# Patient Record
Sex: Male | Born: 1975 | Race: Black or African American | Hispanic: No | Marital: Married | Smoking: Never smoker
Health system: Southern US, Community
[De-identification: ages and names within clinical notes are randomized; demographics above are authoritative.]

---

## 2006-09-20 ENCOUNTER — Emergency Department (HOSPITAL_COMMUNITY): Admission: EM | Admit: 2006-09-20 | Discharge: 2006-09-20 | Payer: Self-pay | Admitting: Emergency Medicine

## 2008-09-21 ENCOUNTER — Ambulatory Visit (HOSPITAL_COMMUNITY): Admission: RE | Admit: 2008-09-21 | Discharge: 2008-09-21 | Payer: Self-pay | Admitting: Family Medicine

## 2009-07-08 ENCOUNTER — Emergency Department (HOSPITAL_COMMUNITY): Admission: EM | Admit: 2009-07-08 | Discharge: 2009-07-08 | Payer: Self-pay | Admitting: Emergency Medicine

## 2010-02-25 ENCOUNTER — Emergency Department (HOSPITAL_COMMUNITY): Admission: EM | Admit: 2010-02-25 | Discharge: 2010-02-25 | Payer: Self-pay | Admitting: Emergency Medicine

## 2010-04-22 ENCOUNTER — Encounter: Admission: RE | Admit: 2010-04-22 | Discharge: 2010-04-22 | Payer: Self-pay | Admitting: Orthopedic Surgery

## 2011-10-17 IMAGING — US US EXTREM LOW VENOUS*R*
1 series · 14 of 24 positions shown · non-contrast
Comparison: None.

CLINICAL DATA: 6 weeks proximal right calf pain.  Right Achilles
tendon repair 6 weeks ago.

RIGHT LOWER EXTREMITY VENOUS DUPLEX ULTRASOUND
TECHNIQUE: Gray-scale sonography with graded compression, as well
as color Doppler and duplex ultrasound were performed to evaluate
the deep venous system of the lower extremity from the level of the
common femoral vein through the popliteal and proximal calf veins.
Spectral Doppler was utilized to evaluate flow at rest and with
distal augmentation maneuvers.

[Series 1: us extrem low venous*right* · 14 of 34 slices shown]
[im 1/34]
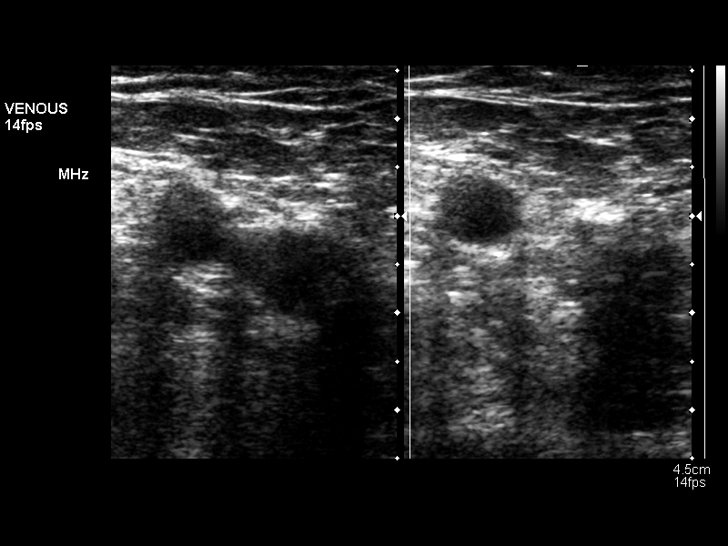
[im 3/34]
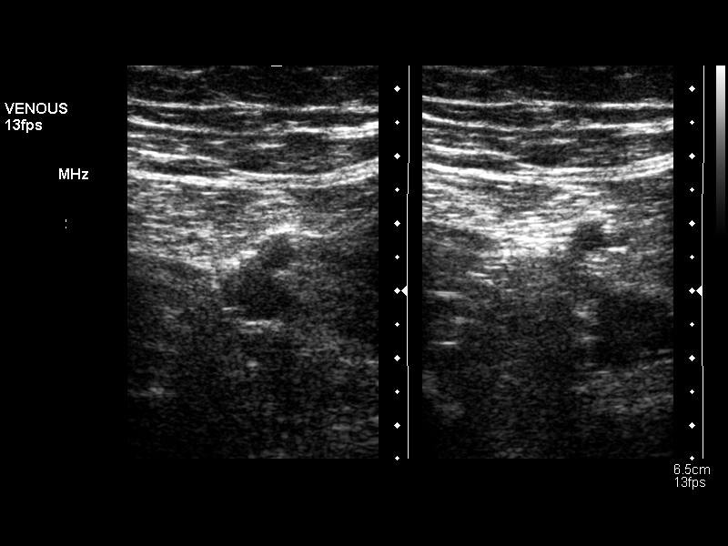
[im 6/34]
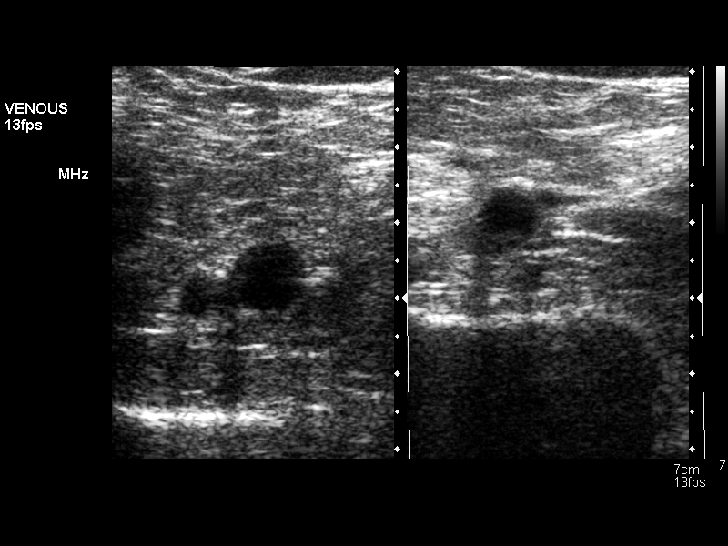
[im 9/34]
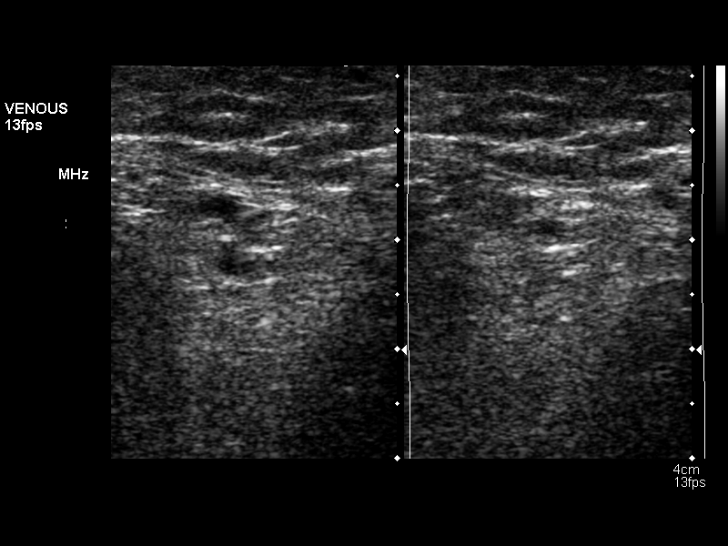
[im 11/34]
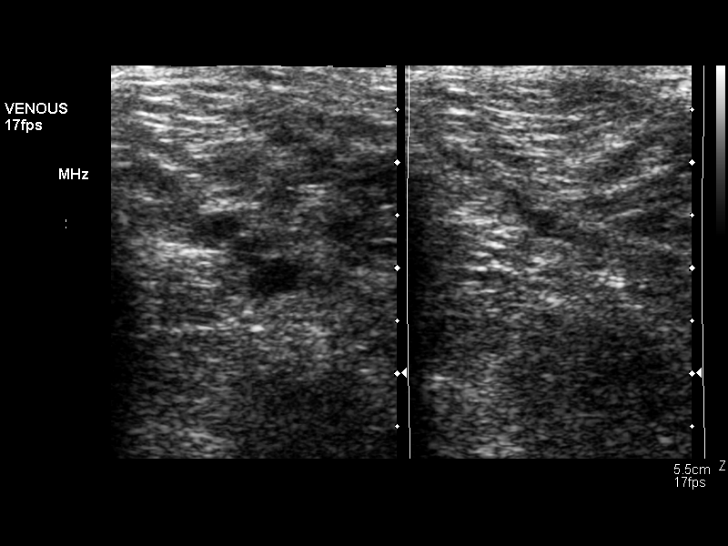
[im 13/34]
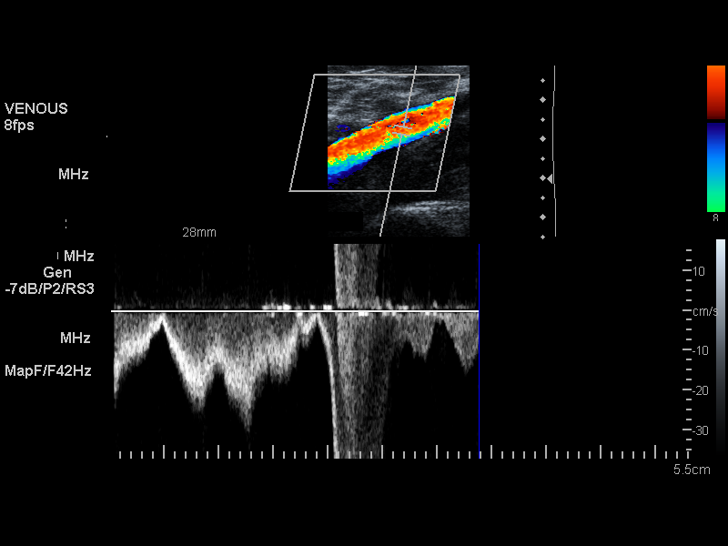
[im 16/34]
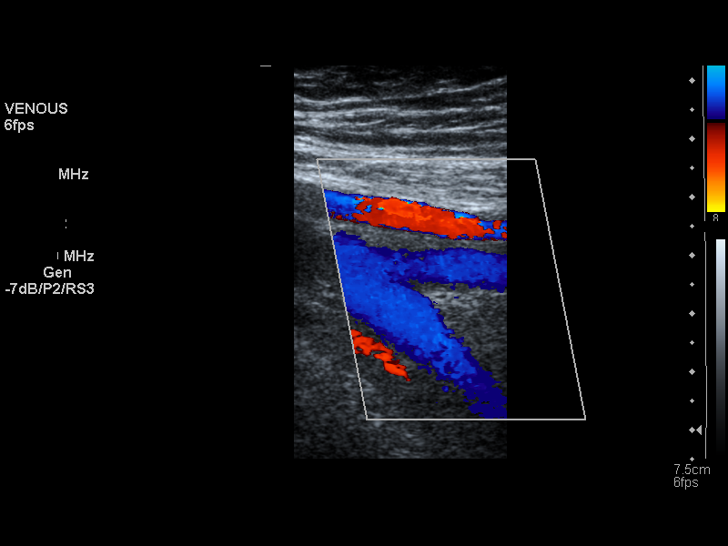
[im 18/34]
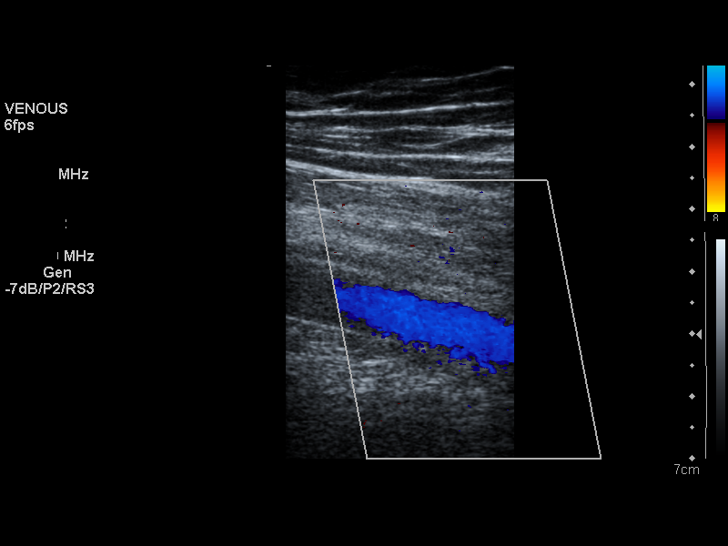
[im 21/34]
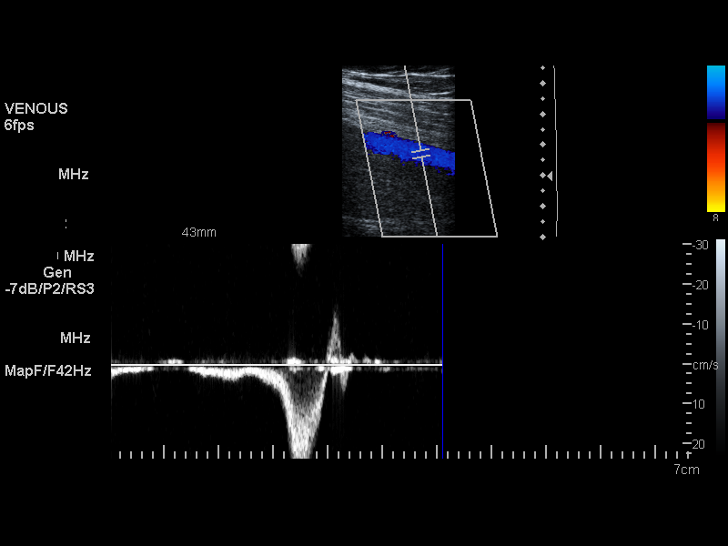
[im 23/34]
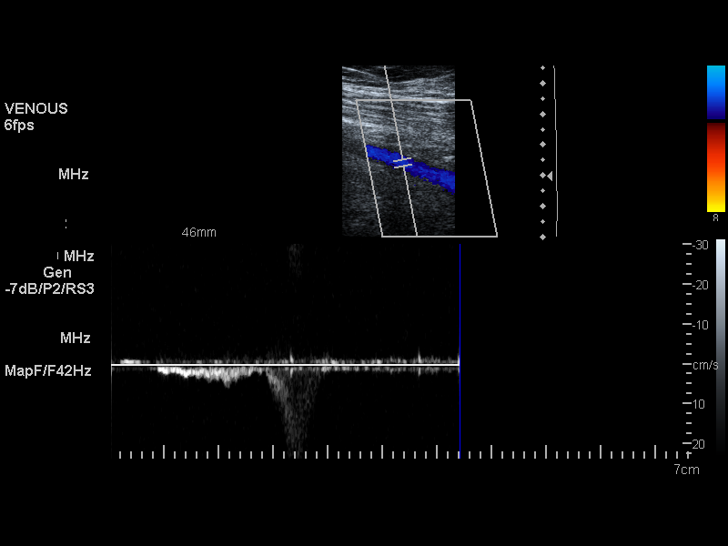
[im 26/34]
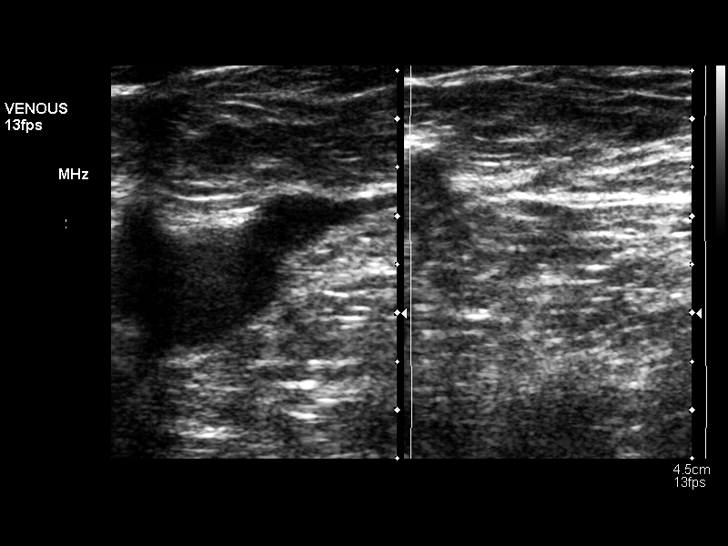
[im 28/34]
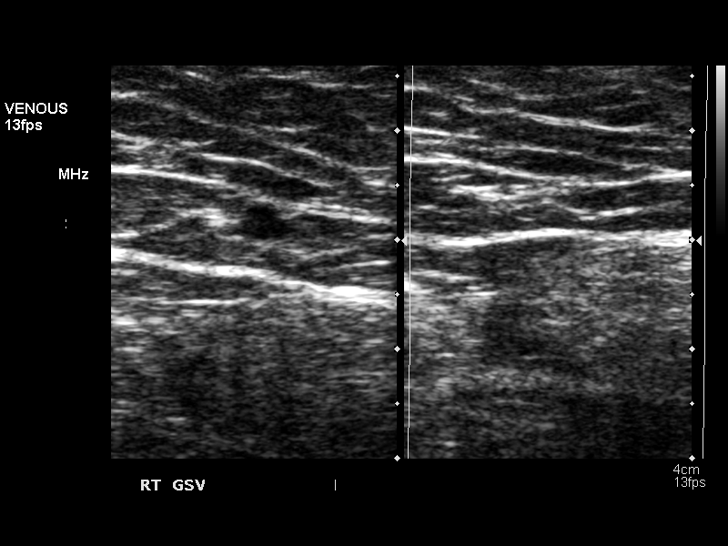
[im 31/34]
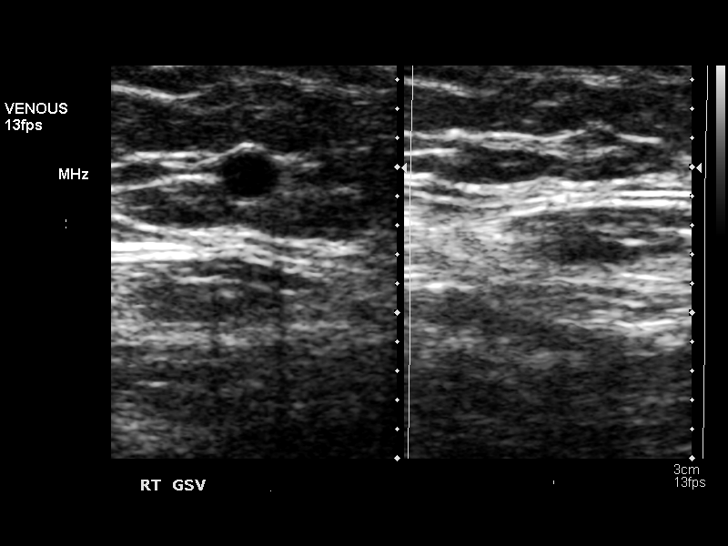
[im 34/34]
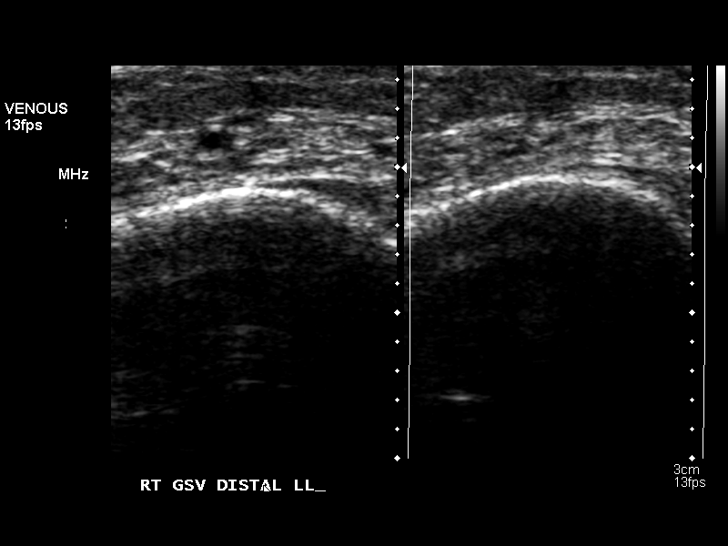

[14 of 24 positions shown; findings below may reference images not displayed]

FINDINGS: Normal compressibility of the common femoral,
superficial femoral, and popliteal veins is demonstrated, as well
as the visualized proximal calf veins.  No filling defects to
suggest DVT on grayscale or color Doppler imaging.  Doppler
waveforms show normal direction of venous flow, normal respiratory
phasicity and response to augmentation.
IMPRESSION: No evidence of lower extremity deep vein thrombosis.

## 2017-03-12 ENCOUNTER — Other Ambulatory Visit (HOSPITAL_BASED_OUTPATIENT_CLINIC_OR_DEPARTMENT_OTHER): Payer: Self-pay

## 2017-03-12 DIAGNOSIS — R0683 Snoring: Secondary | ICD-10-CM

## 2017-04-29 ENCOUNTER — Ambulatory Visit (HOSPITAL_BASED_OUTPATIENT_CLINIC_OR_DEPARTMENT_OTHER): Payer: 59 | Attending: Otolaryngology | Admitting: Internal Medicine

## 2017-04-29 DIAGNOSIS — G4733 Obstructive sleep apnea (adult) (pediatric): Secondary | ICD-10-CM | POA: Insufficient documentation

## 2017-04-29 DIAGNOSIS — R0683 Snoring: Secondary | ICD-10-CM

## 2017-04-30 ENCOUNTER — Other Ambulatory Visit (HOSPITAL_BASED_OUTPATIENT_CLINIC_OR_DEPARTMENT_OTHER): Payer: Self-pay

## 2017-04-30 DIAGNOSIS — R0683 Snoring: Secondary | ICD-10-CM

## 2017-05-01 NOTE — Procedures (Signed)
  Patient Name: Benjamin Blevins, Benjamin Blevins: 04/29/2016 Gender: Male D.O.B: 02-19-1976 Age (years): 40 Referring Provider: Serena ColonelJefry Rosen Height (inches): 72 Interpreting Physician: Jetty Duhamellinton Malania Gawthrop MD, ABSM Weight (lbs): 290 RPSGT: Melburn PopperWillard, Susan BMI: 39 MRN: [id number] Neck Size: 17.00 CLINICAL INFORMATION Sleep Study Type: NPSG  Indication for sleep study: Morbid Obesity, Morning Headaches, Snoring  Epworth Sleepiness Score: 6  SLEEP STUDY TECHNIQUE As per the AASM Manual for the Scoring of Sleep and Associated Events v2.3 (April 2016) with a hypopnea requiring 4% desaturations.  The channels recorded and monitored were frontal, central and occipital EEG, electrooculogram (EOG), submentalis EMG (chin), nasal and oral airflow, thoracic and abdominal wall motion, anterior tibialis EMG, snore microphone, electrocardiogram, and pulse oximetry.  MEDICATIONS Medications self-administered by patient taken the night of the study : none reported  SLEEP ARCHITECTURE The study was initiated at 10:53:41 PM and ended at 4:54:59 AM.  Sleep onset time was 7.1 minutes and the sleep efficiency was 93.7%. The total sleep time was 338.5 minutes.  Stage REM latency was 163.5 minutes.  The patient spent 6.79% of the night in stage N1 sleep, 83.46% in stage N2 sleep, 0.00% in stage N3 and 9.75% in REM.  Alpha intrusion was absent.  Supine sleep was 85.63%.  RESPIRATORY PARAMETERS The overall apnea/hypopnea index (AHI) was 11.2 per hour. There were 36 total apneas, including 34 obstructive, 0 central and 2 mixed apneas. There were 27 hypopneas and 6 RERAs.  The AHI during Stage REM sleep was 40.0 per hour.  AHI while supine was 11.0 per hour.  The mean oxygen saturation was 96.04%. The minimum SpO2 during sleep was 87.00%.  Loud snoring was noted during this study.  CARDIAC DATA The 2 lead EKG demonstrated sinus rhythm. The mean heart rate was 83.46 beats per minute. Other EKG  findings include: None.  LEG MOVEMENT DATA The total PLMS were 0 with a resulting PLMS index of 0.00. Associated arousal with leg movement index was 0.0 .  IMPRESSIONS - Mild obstructive sleep apnea occurred during this study (AHI = 11.2/h). - No significant central sleep apnea occurred during this study (CAI = 0.0/h). - Mild oxygen desaturation was noted during this study (Min O2 = 87.00%). - The patient snored with Loud snoring volume. - No cardiac abnormalities were noted during this study. - Clinically significant periodic limb movements did not occur during sleep. No significant associated arousals.  DIAGNOSIS - Obstructive Sleep Apnea (327.23 [G47.33 ICD-10])  RECOMMENDATIONS - Therapeutic options based on clinical judgment, may include CPAP, fitted oral appliance, or surgical evaluation. - Avoid alcohol, sedatives and other CNS depressants that may worsen sleep apnea and disrupt normal sleep architecture. - Sleep hygiene should be reviewed to assess factors that may improve sleep quality. - Weight management and regular exercise should be initiated or continued if appropriate.  [Electronically signed] 05/01/2017 03:18 PM  Jetty Duhamellinton Irish Piech MD, ABSM Diplomate, American Board of Sleep Medicine   NPI: [id number]  Waymon BudgeYOUNG,Iver Fehrenbach D Diplomate, American Board of Sleep Medicine  ELECTRONICALLY SIGNED ON:  05/01/2017, 3:15 PM [facility] PH: [phone]   FX: [phone] ACCREDITED BY THE AMERICAN ACADEMY OF SLEEP MEDICINE

## 2021-11-16 ENCOUNTER — Ambulatory Visit
Admission: EM | Admit: 2021-11-16 | Discharge: 2021-11-16 | Disposition: A | Discharge status: Home / Self Care | Payer: 59

## 2021-11-16 ENCOUNTER — Other Ambulatory Visit: Payer: Self-pay

## 2021-11-16 DIAGNOSIS — J019 Acute sinusitis, unspecified: Secondary | ICD-10-CM | POA: Diagnosis not present

## 2021-11-16 DIAGNOSIS — B9789 Other viral agents as the cause of diseases classified elsewhere: Secondary | ICD-10-CM

## 2021-11-16 MED ORDER — PREDNISONE 20 MG PO TABS: 40.0000 mg | ORAL_TABLET | Freq: Every day | ORAL | 0 refills | Status: AC

## 2021-11-16 MED ORDER — OSELTAMIVIR PHOSPHATE 75 MG PO CAPS: 75.0000 mg | ORAL_CAPSULE | Freq: Two times a day (BID) | ORAL | 0 refills | Status: AC

## 2021-11-16 NOTE — ED Triage Notes (Signed)
Patient states he thinks he has a sinus infection. He woke up with scratchy throat and congestion and felt tired all day. He took an Catering manager Cold this morning at 9am.   Denies Fever.

## 2021-11-16 NOTE — ED Provider Notes (Signed)
[facility]    CSN: [id number] Arrival date & time: 11/16/21  0623      History   Chief Complaint No chief complaint on file.   HPI Benjamin Blevins is a 45 y.o. male.   Presenting today with 3-day history of scratchy throat, sinus pressure, postnasal drip, fatigue.  Denies chest pain, shortness of breath, abdominal pain, nausea vomiting diarrhea, fever, chills, body aches.  Has been taking Alka-Seltzer cold and sinus with mild temporary relief of symptoms.  Daughter sick with flulike symptoms.  No known pertinent chronic medical problems per patient.   History reviewed. No pertinent past medical history.  There are no problems to display for this patient.   History reviewed. No pertinent surgical history.     Home Medications    Prior to Admission medications   Medication Sig Start Date End Date Taking? Authorizing Provider  omeprazole (PRILOSEC) 20 MG capsule Take 20 mg by mouth daily.   Yes [provider]  oseltamivir (TAMIFLU) 75 MG capsule Take 1 capsule (75 mg total) by mouth every 12 (twelve) hours. 11/16/21  Yes Particia Nearing, PA-C  predniSONE (DELTASONE) 20 MG tablet Take 2 tablets (40 mg total) by mouth daily with breakfast. 11/16/21  Yes Particia Nearing, PA-C    Family History History reviewed. No pertinent family history.  Social History Social History   Tobacco Use   Smoking status: Never   Smokeless tobacco: Never     Allergies   Patient has no allergy information on record.   Review of Systems Review of Systems Per HPI  Physical Exam Triage Vital Signs ED Triage Vitals  Enc Vitals Group     BP 11/16/21 1044 (!) 147/92     Pulse Rate 11/16/21 1044 96     Resp 11/16/21 1044 20     Temp 11/16/21 1044 98.5 F (36.9 C)     Temp Source 11/16/21 1044 Oral     SpO2 11/16/21 1044 95 %     Weight --      Height --      Head Circumference --      Peak Flow --      Pain Score 11/16/21 1042 0      Pain Loc --      Pain Edu? --      Excl. in GC? --    No data found.  Updated Vital Signs BP (!) 147/92 (BP Location: Right Arm)   Pulse 96   Temp 98.5 F (36.9 C) (Oral)   Resp 20   SpO2 95%   Visual Acuity Right Eye Distance:   Left Eye Distance:   Bilateral Distance:    Right Eye Near:   Left Eye Near:    Bilateral Near:     Physical Exam Vitals and nursing note reviewed.  Constitutional:      Appearance: He is well-developed.  HENT:     Head: Atraumatic.     Right Ear: External ear normal.     Left Ear: External ear normal.     Nose: Rhinorrhea present.     Mouth/Throat:     Pharynx: Posterior oropharyngeal erythema present. No oropharyngeal exudate.  Eyes:     Conjunctiva/sclera: Conjunctivae normal.     Pupils: Pupils are equal, round, and reactive to light.  Cardiovascular:     Rate and Rhythm: Normal rate and regular rhythm.  Pulmonary:     Effort: Pulmonary effort is normal. No respiratory distress.  Breath sounds: No wheezing or rales.  Musculoskeletal:        General: Normal range of motion.     Cervical back: Normal range of motion and neck supple.  Lymphadenopathy:     Cervical: No cervical adenopathy.  Skin:    General: Skin is warm and dry.  Neurological:     Mental Status: He is alert and oriented to person, place, and time.  Psychiatric:        Behavior: Behavior normal.     UC Treatments / Results  Labs (all labs ordered are listed, but only abnormal results are displayed) Labs Reviewed - No data to display  EKG   Radiology No results found.  Procedures Procedures (including critical care time)  Medications Ordered in UC Medications - No data to display  Initial Impression / Assessment and Plan / UC Course  I have reviewed the triage vital signs and the nursing notes.  Pertinent labs & imaging results that were available during my care of the patient were reviewed by me and considered in my medical decision making (see  chart for details).     Consistent with viral upper respiratory infection, given exposure to child with flulike symptoms will start Tamiflu proactively, he declines viral testing today.  We will also treat with prednisone burst given inflammatory sinus symptoms.  Recommended Flonase and Zyrtec additionally for symptomatic benefit.  Return for acutely worsening symptoms.  Work note given.  Final Clinical Impressions(s) / UC Diagnoses   Final diagnoses:  Acute viral sinusitis   Discharge Instructions   None    ED Prescriptions     Medication Sig Dispense Auth. Provider   predniSONE (DELTASONE) 20 MG tablet Take 2 tablets (40 mg total) by mouth daily with breakfast. 10 tablet Particia Nearing, PA-C   oseltamivir (TAMIFLU) 75 MG capsule Take 1 capsule (75 mg total) by mouth every 12 (twelve) hours. 10 capsule Particia Nearing, [place]      PDMP not reviewed this encounter.   Particia Nearing, [place] 11/16/21 1211

## 2023-04-09 ENCOUNTER — Other Ambulatory Visit (HOSPITAL_COMMUNITY): Payer: Self-pay | Admitting: Family Medicine

## 2023-04-09 DIAGNOSIS — E7801 Familial hypercholesterolemia: Secondary | ICD-10-CM

## 2023-06-04 ENCOUNTER — Ambulatory Visit (HOSPITAL_COMMUNITY)
Admission: RE | Admit: 2023-06-04 | Discharge: 2023-06-04 | Disposition: A | Discharge status: Home / Self Care | Payer: Self-pay | Source: Ambulatory Visit | Attending: Family Medicine | Admitting: Family Medicine

## 2023-06-04 DIAGNOSIS — E7801 Familial hypercholesterolemia: Secondary | ICD-10-CM | POA: Insufficient documentation

## 2023-11-23 ENCOUNTER — Encounter: Payer: Self-pay | Admitting: *Deleted

## 2024-05-24 ENCOUNTER — Encounter (INDEPENDENT_AMBULATORY_CARE_PROVIDER_SITE_OTHER): Payer: Self-pay | Admitting: *Deleted

## 2025-01-03 ENCOUNTER — Ambulatory Visit: Admitting: Orthopedic Surgery

## 2025-01-03 ENCOUNTER — Other Ambulatory Visit: Payer: Self-pay

## 2025-01-03 VITALS — BP 127/84 | HR 111 | Ht 72.0 in | Wt 308.0 lb

## 2025-01-03 DIAGNOSIS — M1611 Unilateral primary osteoarthritis, right hip: Secondary | ICD-10-CM

## 2025-01-03 DIAGNOSIS — M25362 Other instability, left knee: Secondary | ICD-10-CM

## 2025-01-03 DIAGNOSIS — M25551 Pain in right hip: Secondary | ICD-10-CM

## 2025-01-04 ENCOUNTER — Encounter: Payer: Self-pay | Admitting: Orthopedic Surgery
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
